# Patient Record
Sex: Female | Born: 1945 | Race: White | Hispanic: No | Marital: Married
Health system: Southern US, Community
[De-identification: ages and names within clinical notes are randomized; demographics above are authoritative.]

---

## 1998-09-10 ENCOUNTER — Other Ambulatory Visit: Admission: RE | Admit: 1998-09-10 | Discharge: 1998-09-10 | Payer: Self-pay | Admitting: *Deleted

## 2000-01-09 ENCOUNTER — Other Ambulatory Visit: Admission: RE | Admit: 2000-01-09 | Discharge: 2000-01-09 | Payer: Self-pay | Admitting: Obstetrics & Gynecology

## 2000-03-05 ENCOUNTER — Encounter: Admission: RE | Admit: 2000-03-05 | Discharge: 2000-03-05 | Payer: Self-pay | Admitting: Obstetrics & Gynecology

## 2000-03-05 ENCOUNTER — Encounter: Payer: Self-pay | Admitting: Obstetrics & Gynecology

## 2000-04-10 ENCOUNTER — Other Ambulatory Visit: Admission: RE | Admit: 2000-04-10 | Discharge: 2000-04-10 | Payer: Self-pay | Admitting: Obstetrics & Gynecology

## 2000-09-23 ENCOUNTER — Other Ambulatory Visit: Admission: RE | Admit: 2000-09-23 | Discharge: 2000-09-23 | Payer: Self-pay | Admitting: Obstetrics & Gynecology

## 2001-02-24 ENCOUNTER — Other Ambulatory Visit: Admission: RE | Admit: 2001-02-24 | Discharge: 2001-02-24 | Payer: Self-pay | Admitting: Obstetrics & Gynecology

## 2001-03-08 ENCOUNTER — Encounter: Admission: RE | Admit: 2001-03-08 | Discharge: 2001-03-08 | Payer: Self-pay | Admitting: Obstetrics & Gynecology

## 2001-03-08 ENCOUNTER — Encounter: Payer: Self-pay | Admitting: Obstetrics & Gynecology

## 2004-02-13 ENCOUNTER — Ambulatory Visit: Payer: Self-pay | Admitting: Unknown Physician Specialty

## 2008-09-14 ENCOUNTER — Ambulatory Visit: Payer: Self-pay

## 2009-02-12 ENCOUNTER — Ambulatory Visit: Payer: Self-pay | Admitting: General Practice

## 2009-02-26 ENCOUNTER — Inpatient Hospital Stay: Payer: Self-pay | Admitting: General Practice

## 2009-06-05 ENCOUNTER — Ambulatory Visit: Payer: Self-pay | Admitting: General Practice

## 2009-06-18 ENCOUNTER — Inpatient Hospital Stay: Payer: Self-pay | Admitting: General Practice

## 2011-08-01 ENCOUNTER — Ambulatory Visit: Payer: Self-pay | Admitting: Family Medicine

## 2012-05-13 ENCOUNTER — Ambulatory Visit: Payer: Self-pay | Admitting: Nephrology

## 2012-05-18 ENCOUNTER — Other Ambulatory Visit: Payer: Self-pay | Admitting: Nephrology

## 2012-05-18 LAB — CBC WITH DIFFERENTIAL/PLATELET
Basophil #: 0 10*3/uL (ref 0.0–0.1)
Basophil %: 0.9 %
Eosinophil #: 0.6 10*3/uL (ref 0.0–0.7)
Eosinophil %: 13.9 %
HCT: 35.5 % (ref 35.0–47.0)
HGB: 11.8 g/dL — ABNORMAL LOW (ref 12.0–16.0)
Lymphocyte #: 0.9 10*3/uL — ABNORMAL LOW (ref 1.0–3.6)
Lymphocyte %: 21.6 %
MCH: 31.9 pg (ref 26.0–34.0)
MCHC: 33.2 g/dL (ref 32.0–36.0)
MCV: 96 fL (ref 80–100)
Monocyte #: 0.3 x10 3/mm (ref 0.2–0.9)
Monocyte %: 6.7 %
Neutrophil #: 2.4 10*3/uL (ref 1.4–6.5)
Neutrophil %: 56.9 %
Platelet: 178 10*3/uL (ref 150–440)
RBC: 3.69 10*6/uL — ABNORMAL LOW (ref 3.80–5.20)
RDW: 13.7 % (ref 11.5–14.5)
WBC: 4.2 10*3/uL (ref 3.6–11.0)

## 2012-05-18 LAB — COMPREHENSIVE METABOLIC PANEL
Albumin: 3.8 g/dL (ref 3.4–5.0)
Alkaline Phosphatase: 51 U/L (ref 50–136)
Anion Gap: 9 (ref 7–16)
BUN: 39 mg/dL — ABNORMAL HIGH (ref 7–18)
Bilirubin,Total: 0.2 mg/dL (ref 0.2–1.0)
Calcium, Total: 9.2 mg/dL (ref 8.5–10.1)
Chloride: 103 mmol/L (ref 98–107)
Co2: 27 mmol/L (ref 21–32)
Creatinine: 1.43 mg/dL — ABNORMAL HIGH (ref 0.60–1.30)
EGFR (African American): 45 — ABNORMAL LOW
EGFR (Non-African Amer.): 39 — ABNORMAL LOW
Glucose: 267 mg/dL — ABNORMAL HIGH (ref 65–99)
Osmolality: 296 (ref 275–301)
Potassium: 4 mmol/L (ref 3.5–5.1)
SGOT(AST): 18 U/L (ref 15–37)
SGPT (ALT): 30 U/L (ref 12–78)
Sodium: 139 mmol/L (ref 136–145)
Total Protein: 7.2 g/dL (ref 6.4–8.2)

## 2012-05-18 LAB — URINALYSIS, COMPLETE
Bilirubin,UR: NEGATIVE
Blood: NEGATIVE
Glucose,UR: >=500 mg/dL (ref 0–75)
Ketone: NEGATIVE
Nitrite: NEGATIVE
Ph: 5 (ref 4.5–8.0)
Protein: NEGATIVE
RBC,UR: 2 /HPF (ref 0–5)
Specific Gravity: 1.012 (ref 1.003–1.030)
Squamous Epithelial: 1
WBC UR: 5 /HPF (ref 0–5)

## 2012-05-18 LAB — PROTEIN / CREATININE RATIO, URINE
Creatinine, Urine: 54.4 mg/dL (ref 30.0–125.0)
Protein, Random Urine: 11 mg/dL (ref 0–12)
Protein/Creat. Ratio: 202 mg/gCREAT — ABNORMAL HIGH (ref 0–200)

## 2012-05-18 LAB — PROTIME-INR
INR: 0.9
Prothrombin Time: 13 secs (ref 11.5–14.7)

## 2012-05-18 LAB — APTT: Activated PTT: 26.9 secs (ref 23.6–35.9)

## 2012-05-20 ENCOUNTER — Observation Stay: Payer: Self-pay | Admitting: Nephrology

## 2012-05-20 LAB — HEMOGLOBIN: HGB: 10.9 g/dL — ABNORMAL LOW (ref 12.0–16.0)

## 2012-05-21 LAB — URINALYSIS, COMPLETE
Bilirubin,UR: NEGATIVE
Glucose,UR: NEGATIVE mg/dL (ref 0–75)
Ketone: NEGATIVE
Nitrite: NEGATIVE
Ph: 5 (ref 4.5–8.0)
Protein: 100
RBC,UR: 584 /HPF (ref 0–5)
Specific Gravity: 1.011 (ref 1.003–1.030)
Squamous Epithelial: <1
WBC UR: 4 /HPF (ref 0–5)

## 2012-05-21 LAB — CBC WITH DIFFERENTIAL/PLATELET
Basophil #: 0 10*3/uL (ref 0.0–0.1)
Basophil %: 0.9 %
Eosinophil #: 0.7 10*3/uL (ref 0.0–0.7)
Eosinophil %: 13.5 %
HCT: 32.7 % — ABNORMAL LOW (ref 35.0–47.0)
HGB: 10.7 g/dL — ABNORMAL LOW (ref 12.0–16.0)
Lymphocyte #: 1.1 10*3/uL (ref 1.0–3.6)
Lymphocyte %: 23 %
MCH: 31.2 pg (ref 26.0–34.0)
MCHC: 32.7 g/dL (ref 32.0–36.0)
MCV: 95 fL (ref 80–100)
Monocyte #: 0.3 x10 3/mm (ref 0.2–0.9)
Monocyte %: 6 %
Neutrophil #: 2.8 10*3/uL (ref 1.4–6.5)
Neutrophil %: 56.6 %
Platelet: 160 10*3/uL (ref 150–440)
RBC: 3.43 10*6/uL — ABNORMAL LOW (ref 3.80–5.20)
RDW: 13.8 % (ref 11.5–14.5)
WBC: 4.9 10*3/uL (ref 3.6–11.0)

## 2012-05-21 LAB — BASIC METABOLIC PANEL
Anion Gap: 7 (ref 7–16)
BUN: 30 mg/dL — ABNORMAL HIGH (ref 7–18)
Calcium, Total: 8.8 mg/dL (ref 8.5–10.1)
Chloride: 110 mmol/L — ABNORMAL HIGH (ref 98–107)
Creatinine: 1.42 mg/dL — ABNORMAL HIGH (ref 0.60–1.30)
Osmolality: 290 (ref 275–301)

## 2012-06-08 ENCOUNTER — Ambulatory Visit: Payer: Self-pay

## 2012-06-12 ENCOUNTER — Ambulatory Visit: Payer: Self-pay

## 2012-06-22 ENCOUNTER — Ambulatory Visit: Payer: Self-pay | Admitting: Ophthalmology

## 2012-07-06 ENCOUNTER — Ambulatory Visit: Payer: Self-pay | Admitting: Ophthalmology

## 2012-07-10 ENCOUNTER — Ambulatory Visit: Payer: Self-pay

## 2012-08-18 ENCOUNTER — Ambulatory Visit: Payer: Self-pay | Admitting: Ophthalmology

## 2013-12-21 ENCOUNTER — Encounter: Payer: Self-pay | Admitting: Obstetrics and Gynecology

## 2014-01-10 ENCOUNTER — Encounter: Payer: Self-pay | Admitting: Obstetrics and Gynecology

## 2014-02-09 ENCOUNTER — Encounter: Payer: Self-pay | Admitting: Obstetrics and Gynecology

## 2014-03-09 IMAGING — CT CT ABD-PELV W/ CM
1 of 3 series · 14 of 32 positions shown, 19 images · IV contrast (isovue)
Comparison: None

REASON FOR EXAM: abd pain gen  LLQ pelvic pain  pt takes Metformin
COMMENTS:

PROCEDURE:     KCT - KCT ABDOMEN/PELVIS W  - August 01, 2011  [DATE]
RESULT:     History: Abdominal pain
TECHNIQUE: Multiple axial images of the abdomen and pelvis were performed
from the lung bases to the pubic symphysis, with p.o. contrast and with 85
mL of Isovue 300 intravenous contrast.

[Series 2: abd with 5.0 i40f 3 · axial · 0.82mm/px · z∈[-262,+128]mm · 14 of 88 slices shown, 19 images]
[im 5/88  soft-tissue]
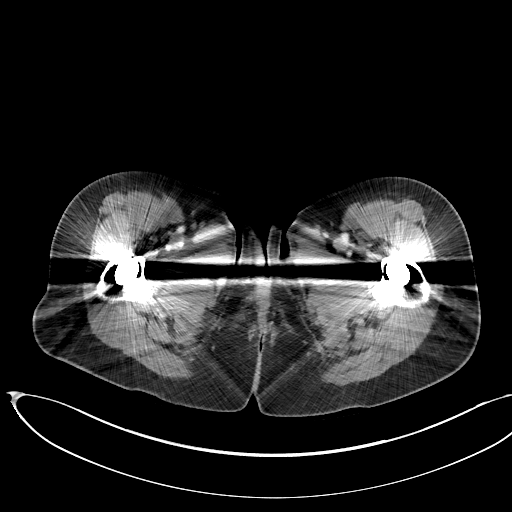
[im 5/88  bone]
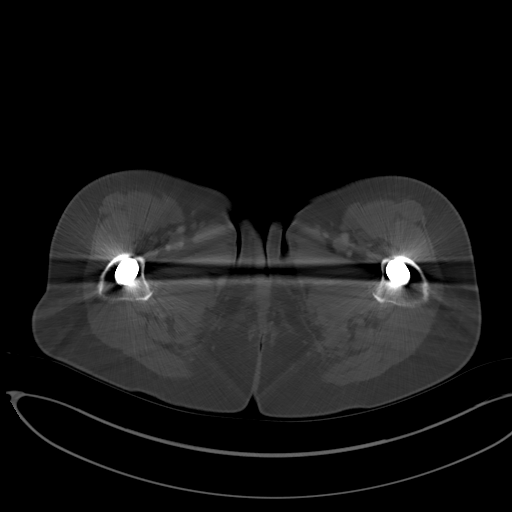
[im 10/88  soft-tissue]
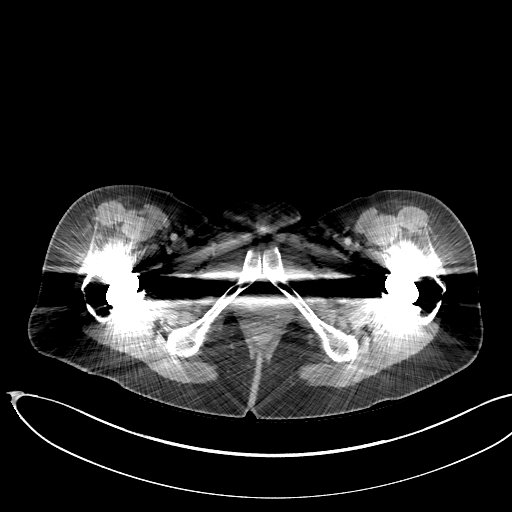
[im 20/88  soft-tissue]
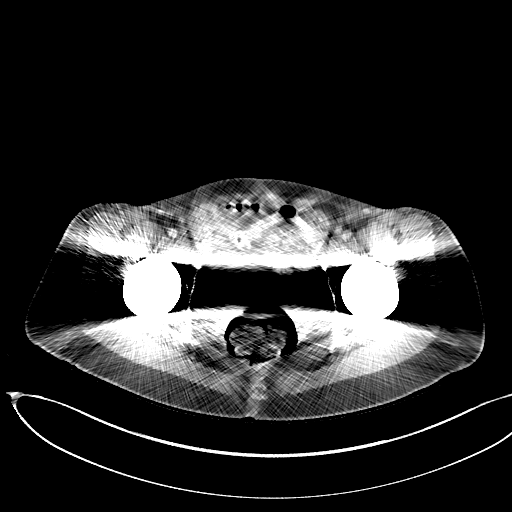
[im 25/88  soft-tissue]
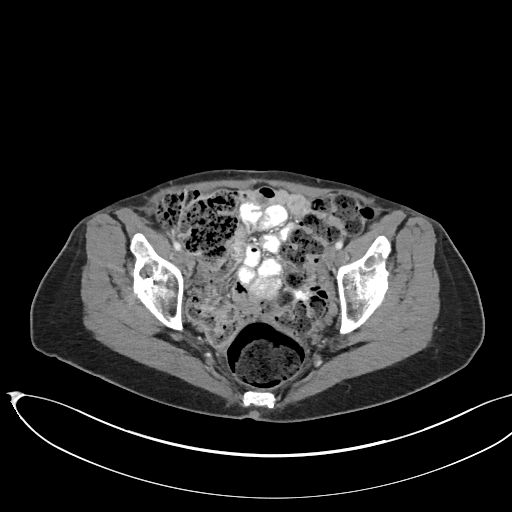
[im 30/88  soft-tissue]
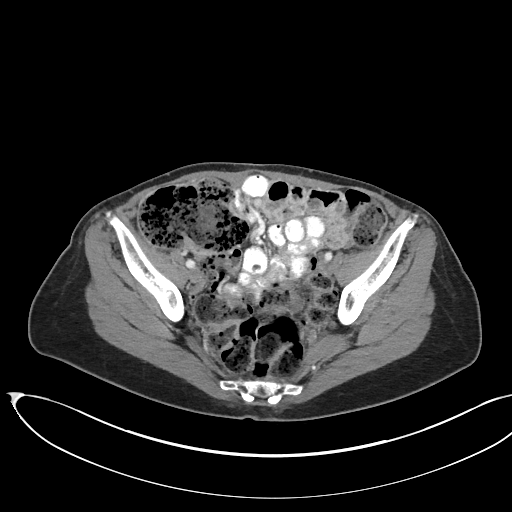
[im 39/88  soft-tissue]
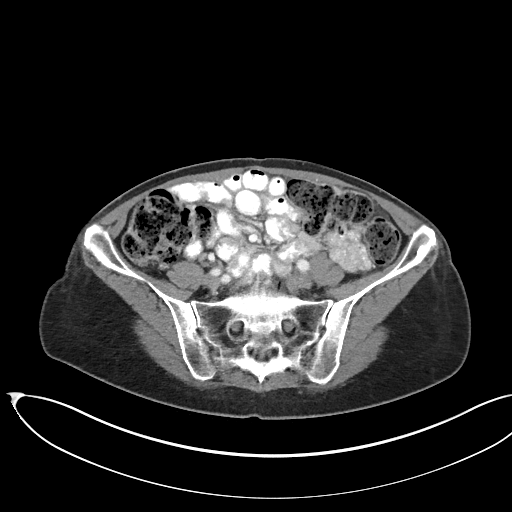
[im 44/88  soft-tissue]
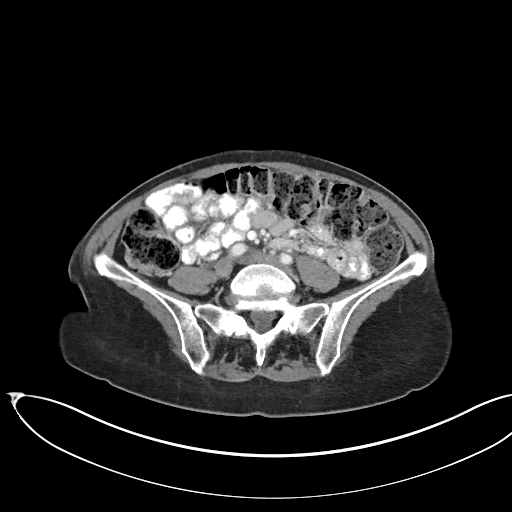
[im 49/88  soft-tissue]
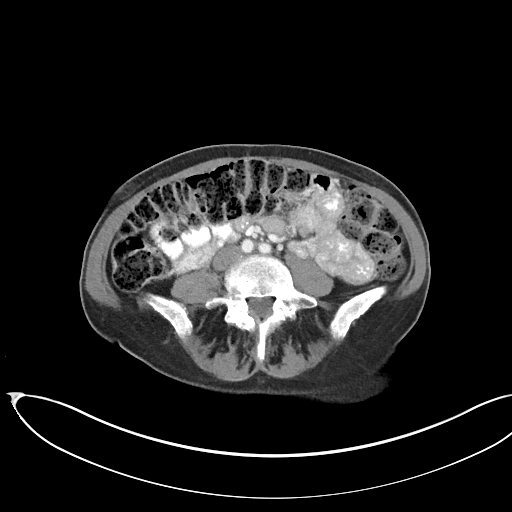
[im 59/88  soft-tissue]
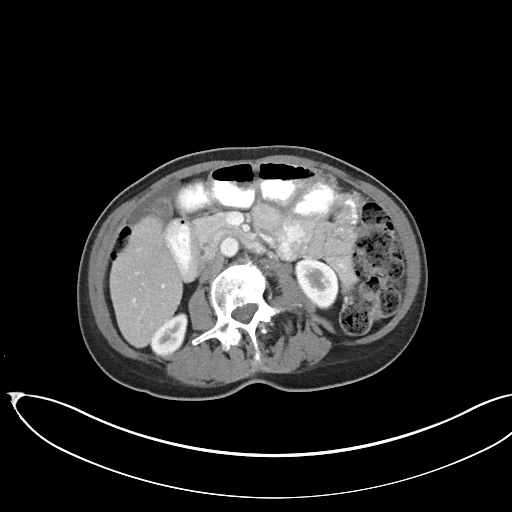
[im 59/88  bone]
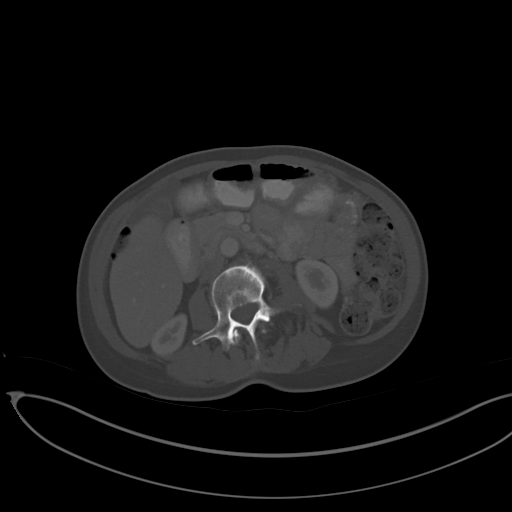
[im 63/88  soft-tissue]
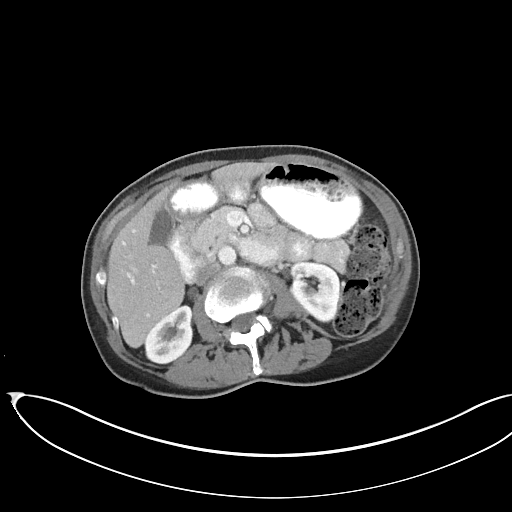
[im 68/88  soft-tissue]
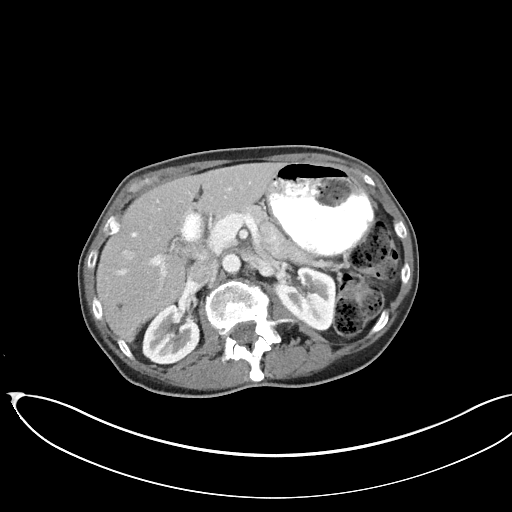
[im 68/88  lung]
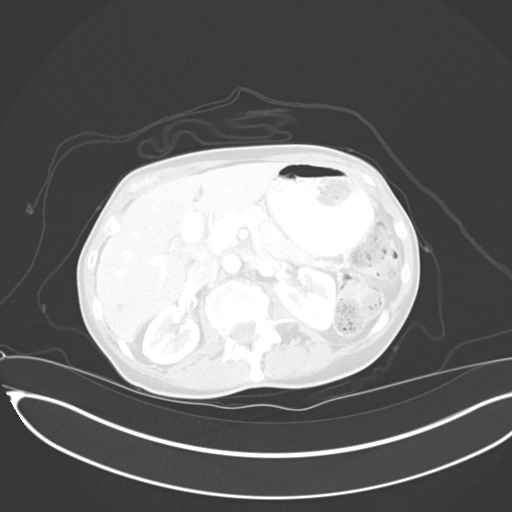
[im 73/88  lung]
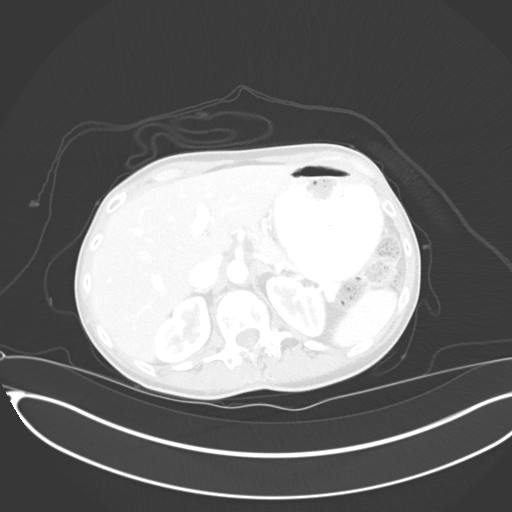
[im 78/88  soft-tissue]
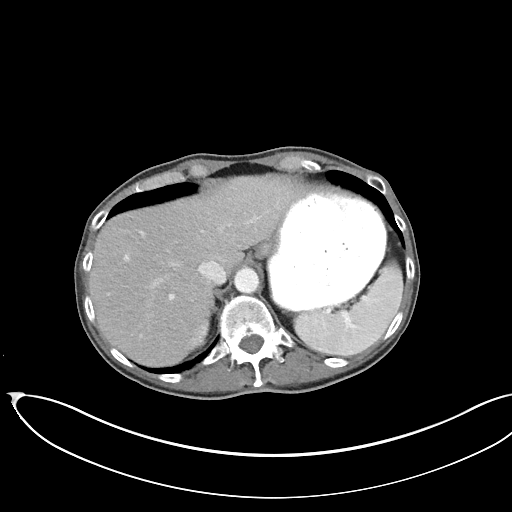
[im 78/88  lung]
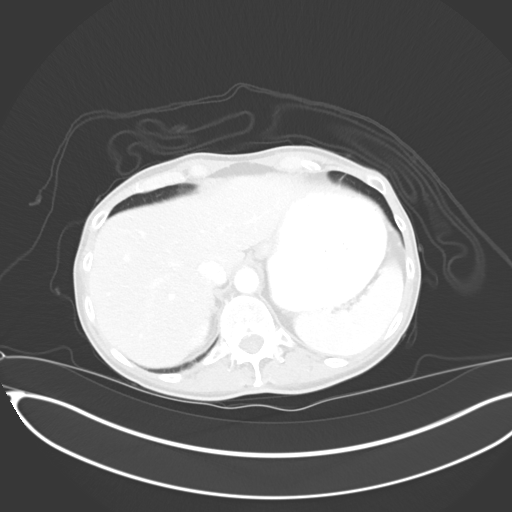
[im 83/88  soft-tissue]
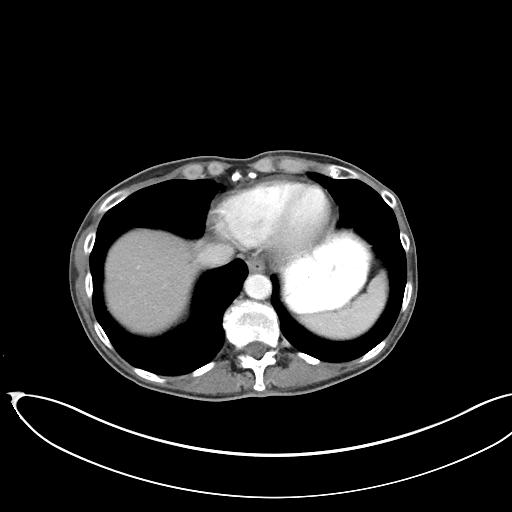
[im 83/88  lung]
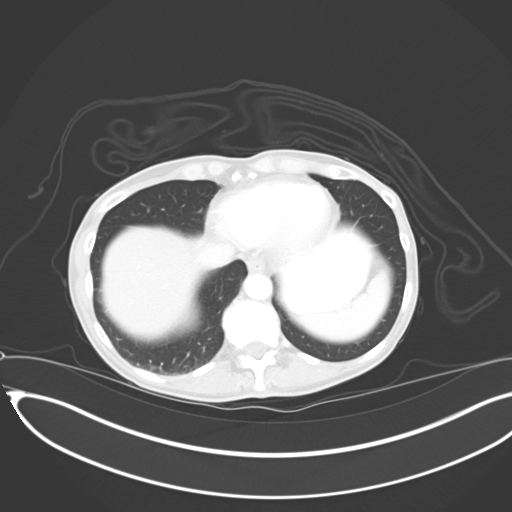

[14 of 32 positions shown; findings below may reference images not displayed]

FINDINGS: The lung bases are clear. There is no pneumothorax. The heart size is
normal.

The liver demonstrates no focal abnormality. There is no intrahepatic or
extrahepatic biliary ductal dilatation. The gallbladder is unremarkable. The
spleen demonstrates no focal abnormality. The kidneys, adrenal glands, and
pancreas are normal. The bladder is unremarkable.

The stomach, duodenum, small intestine, and large intestine demonstrate no
contrast extravasation or dilatation. There is a large amount stool
throughout the colon. There is no pneumoperitoneum, pneumatosis, or portal
venous gas. There is no abdominal or pelvic free fluid. There is no
lymphadenopathy.

The abdominal aorta is normal in caliber .

The osseous structures are unremarkable.
IMPRESSION: 1. No acute abdominal or pelvic pathology.
2. Large amount stool throughout the colon. Correlate with signs and
symptoms of constipation.

## 2014-04-03 ENCOUNTER — Ambulatory Visit: Payer: Self-pay | Admitting: Unknown Physician Specialty

## 2014-09-01 NOTE — Op Note (Signed)
PATIENT NAME:  Angel [name], Angel [name] MR#:  [id number] DATE OF BIRTH:  08-Dec-1945  DATE OF PROCEDURE:  05/20/2012  SURGEON:  Mady HaagensenMunsoor Jebadiah Imperato, M.D.  REASON FOR PROCEDURE AND PREOPERATIVE DIAGNOSES:  1.  Chronic kidney disease, stage III.  2.  Diabetes mellitus, type 2.   POSTOPERATIVE DIAGNOSES: 1.  Chronic kidney disease, stage III.  2.  Diabetes mellitus, type 2.    PROCEDURE: Percutaneous ultrasound-guided left renal biopsy.    DESCRIPTION OF PROCEDURE: After obtaining informed consent, the patient was brought down to the ultrasound suite. Subsequently, the left kidney was identified under ultrasound. The left flank was prepped and draped in standard sterile fashion. Local anesthesia was achieved using 1% lidocaine. Subsequently, using an 18-gauge biopsy device and ultrasound guidance, [name] total of 3 passes were made into the left kidney. The specimens were submitted to pathology, and the pathologist deemed the specimens to be adequate. No immediate complications were noted. The patient tolerated the procedure very well. She will return to her room for continued observation.   ESTIMATED BLOOD LOSS: None.   COMPLICATIONS: No immediate complications.    ____________________________ Lennox PippinsMunsoor N. Daymon Hora, MD mnl:dm D: 05/20/2012 11:49:43 ET T: 05/20/2012 12:00:53 ET JOB#: [id number]  cc: Lennox PippinsMunsoor N. Jandel Patriarca, MD, <Dictator> Rhona LeavensJames F. Burnett ShengHedrick, MD Lennox PippinsMUNSOOR N Enez Monahan MD ELECTRONICALLY SIGNED 06/18/2012 22:47

## 2014-09-01 NOTE — Op Note (Signed)
PATIENT NAME:  Angel [name], Angel [name] MR#:  [id number] DATE OF BIRTH:  October 28, 1945  DATE OF PROCEDURE:  07/06/2012  PREOPERATIVE DIAGNOSIS: Visually significant cataract of the left eye.   POSTOPERATIVE DIAGNOSIS: Visually significant cataract of the left eye.   OPERATIVE PROCEDURE: Cataract extraction by phacoemulsification with implant of torque intraocular lens to the left eye.  Please note the lens was rotated to [name] final resting position of 54 degrees.  SURGEON: Galen ManilaWilliam Taelor Moncada, MD.   ANESTHESIA:  1. Managed anesthesia care.  2. Topical tetracaine drops followed by 2% Xylocaine jelly applied in the preoperative holding area.   COMPLICATIONS: None.   TECHNIQUE:  Stop and chop.  DESCRIPTION OF PROCEDURE: The patient was examined and consented in the preoperative holding area where the aforementioned topical anesthesia was applied to the left eye and then brought back to the Operating Room where the left eye was prepped and draped in the usual sterile ophthalmic fashion and [name] lid speculum was placed. [name] paracentesis was created with the side port blade and the anterior chamber was filled with viscoelastic. [name] near clear corneal incision was performed with the steel keratome. [name] continuous curvilinear capsulorrhexis was performed with [name] cystotome followed by the capsulorrhexis forceps. Hydrodissection and hydrodelineation were carried out with BSS on [name] blunt cannula. The lens was removed in [name] stop and chop technique and the remaining cortical material was removed with the irrigation-aspiration handpiece. The capsular bag was inflated with viscoelastic and the Torque ZCT300, 21.0-diopter lens, serial number [id number] was placed in the capsular bag without complication. The remaining viscoelastic was removed from the eye with the irrigation-aspiration handpiece. The wounds were hydrated. The anterior chamber was flushed with Miostat and the eye was inflated to physiologic pressure. 0.1 mL of cefuroxime  concentration 10 mg/mL was placed in the anterior chamber. The wounds were found to be water tight. The eye was dressed with Vigamox. The patient was given protective glasses to wear throughout the day and [name] shield with which to sleep tonight. The patient was also given drops with which to begin [name] drop regimen today and will follow-up with me in one day.     ____________________________ Jerilee FieldWilliam L. Marquinn Meschke, MD wlp:ct D: 07/06/2012 14:06:36 ET T: 07/06/2012 14:46:08 ET JOB#: [id number]  cc: Latrica Clowers L. Meril Dray, MD, <Dictator> Jerilee FieldWILLIAM L Dorlisa Savino MD ELECTRONICALLY SIGNED 07/23/2012 17:10

## 2014-09-01 NOTE — Op Note (Signed)
PATIENT NAME:  Angel [name], Angel [name] MR#:  [id number] DATE OF BIRTH:  1945/10/10  DATE OF PROCEDURE:  08/18/2012  PREOPERATIVE DIAGNOSIS: Malrotated Toric intraocular lens.  POSTOPERATIVE DIAGNOSIS: Malrotated Toric intraocular lens.   OPERATIVE PROCEDURE: Repositioning of intraocular lens in the right eye.   SURGEON: Jerilee FieldWilliam L. Shaniece Bussa, MD.  ANESTHESIA: Topical anesthesia.   COMPLICATIONS: None.   PROCEDURE IN DETAIL: The patient was examined and consented for this procedure in the preoperative holding area where she was then brought back to the operating room where the anesthesia team employed managed anesthesia care. The right eye was prepped and draped in the usual sterile ophthalmic fashion. [name] paracentesis was created and the anterior chamber was filled with DisCoVisc. The DisCoVisc cannula was used to gently Visco-dissect the anterior capsule from the anterior face of the intraocular lens. Viscoelastic was then worked around the entire lens, partially inflating the posterior aspect of the capsule. The Sinskey hook was used to gently release the haptics from the capsular equator. Once the lens was freely mobile within the capsule,  it was rotated to the calculated position of 42 degrees. The viscoelastic was removed from the eye with the irrigation-aspiration handpiece. The wounds were hydrated. The eye was found to be watertight. The anterior chamber was flushed with cefuroxime, 0.1 mL containing 1 mg of cefuroxime.    ____________________________ Jerilee FieldWilliam L. Amoura Ransier, MD wlp:aw D: 08/18/2012 11:15:09 ET T: 08/18/2012 11:43:57 ET JOB#: [id number]  cc: Kishon Garriga L. Amirah Goerke, MD, <Dictator> Jerilee FieldWILLIAM L Zoha Spranger MD ELECTRONICALLY SIGNED 08/24/2012 12:52

## 2014-12-20 IMAGING — CT CT ABD-PELV W/O CM
1 of 2 series · 15 of 32 positions shown, 19 images · non-contrast
Comparison: none

REASON FOR EXAM: CALL REPORT [DATE] CKD III Difficult to visualize left
kidney on office US
COMMENTS:

PROCEDURE:     KCT - KCT ABDOMEN/PELVIS WO  - May 13, 2012  [DATE]
RESULT:     Comparison: 08/01/2011
TECHNIQUE: Multiple axial images from the lung bases to the symphysis pubis
were obtained without oral and without intravenous contrast.

[Series 2: abd 3mm wo 3.0 i40f 3 · axial · 0.69mm/px · z∈[-958,-586]mm · 15 of 134 slices shown, 19 images]
[im 5/134  soft-tissue]
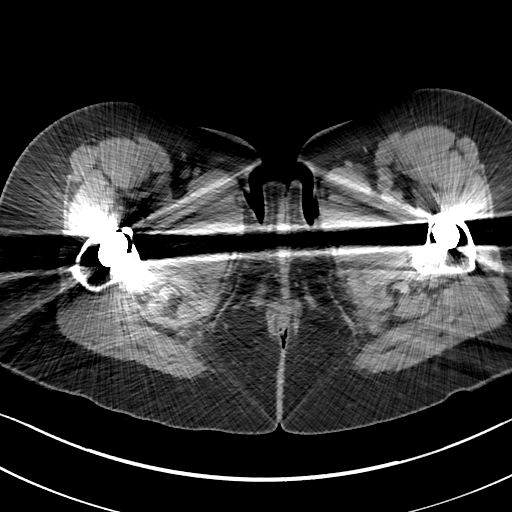
[im 5/134  bone]
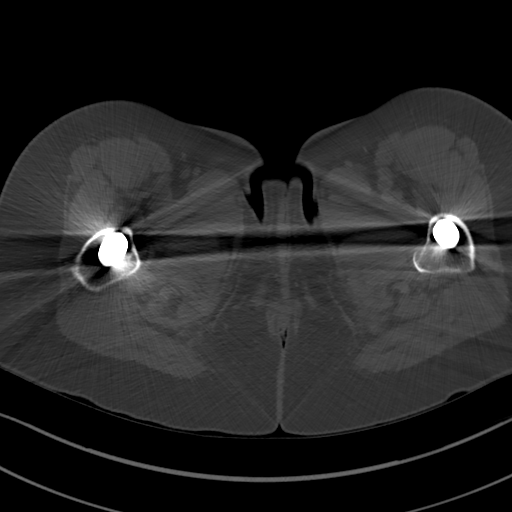
[im 20/134  soft-tissue]
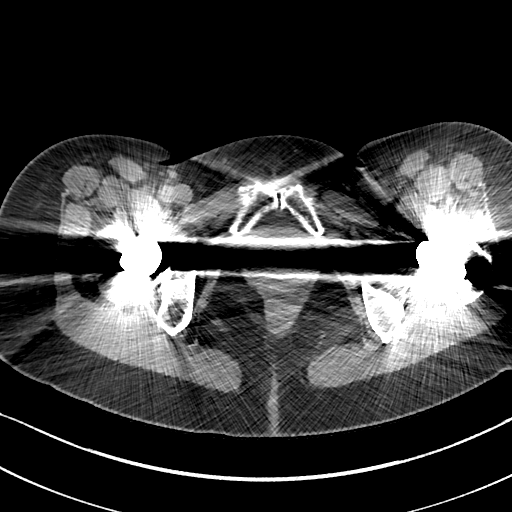
[im 40/134  soft-tissue]
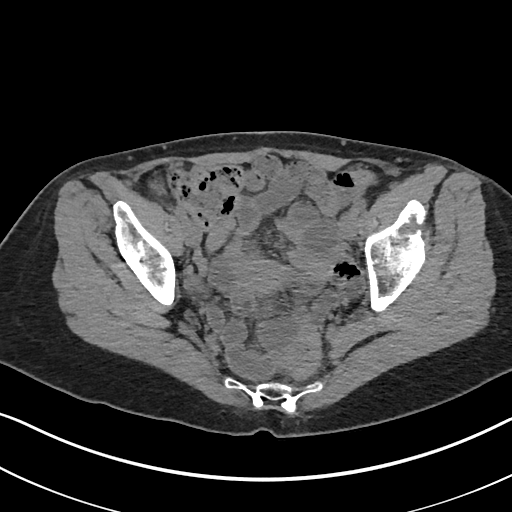
[im 50/134  soft-tissue]
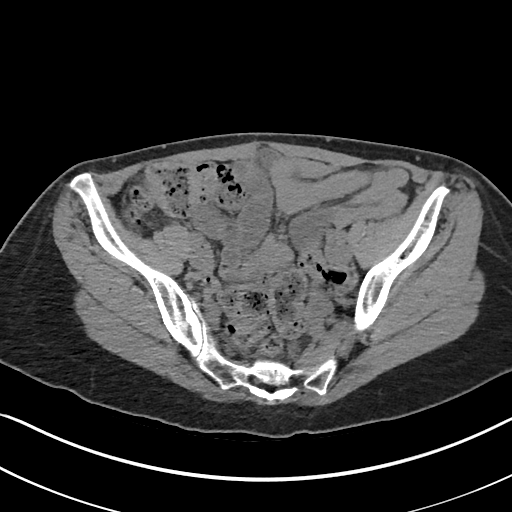
[im 55/134  soft-tissue]
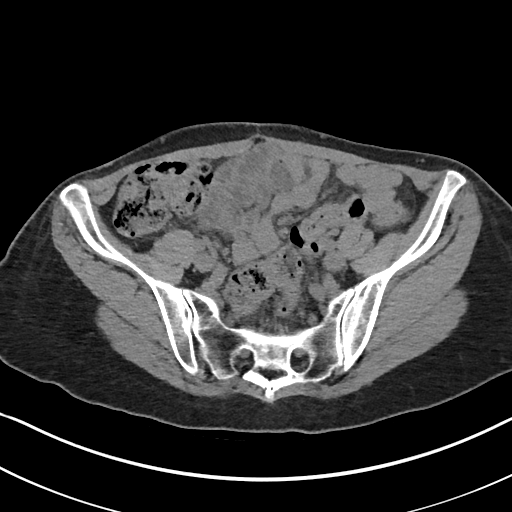
[im 65/134  soft-tissue]
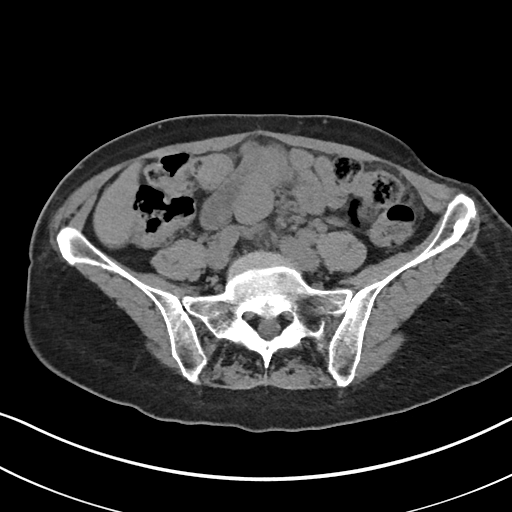
[im 74/134  soft-tissue]
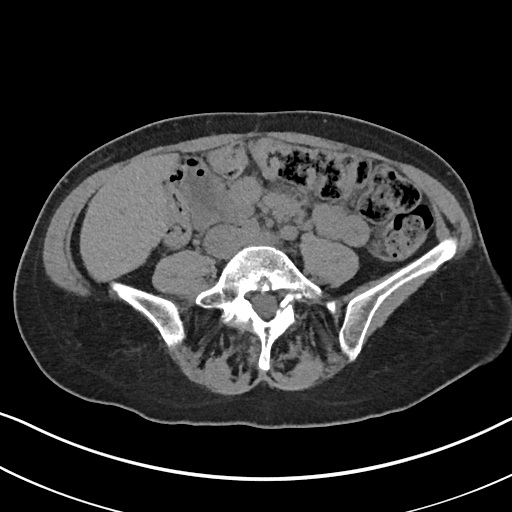
[im 84/134  soft-tissue]
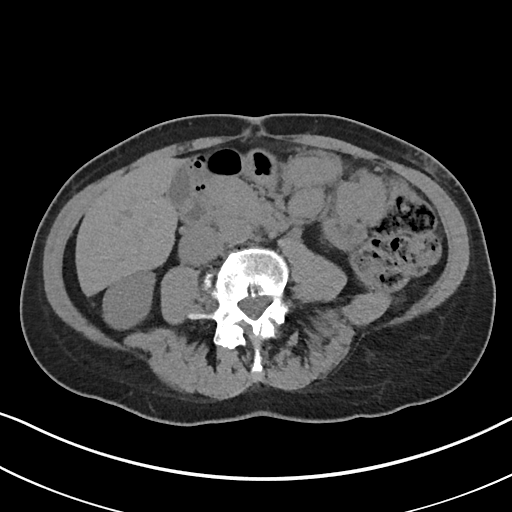
[im 94/134  soft-tissue]
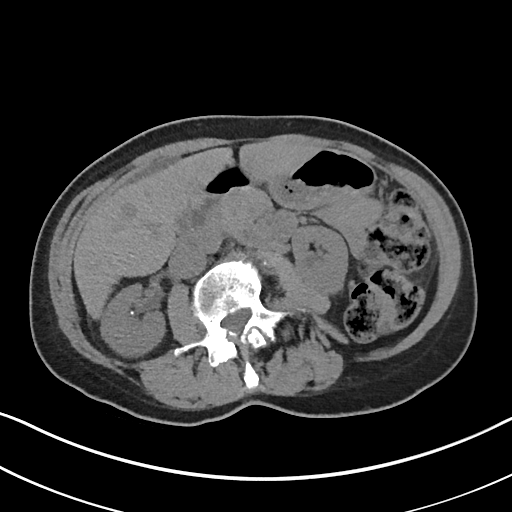
[im 94/134  bone]
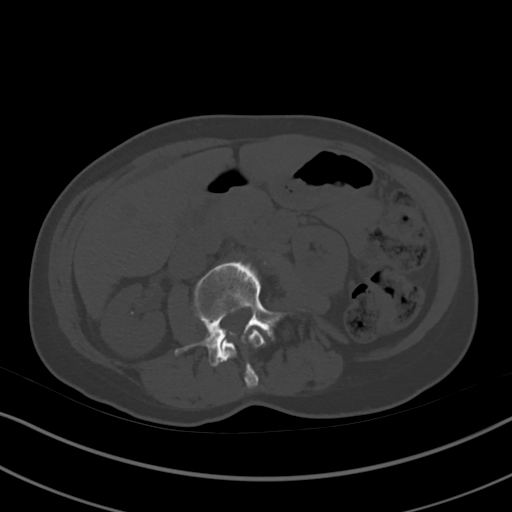
[im 99/134  soft-tissue]
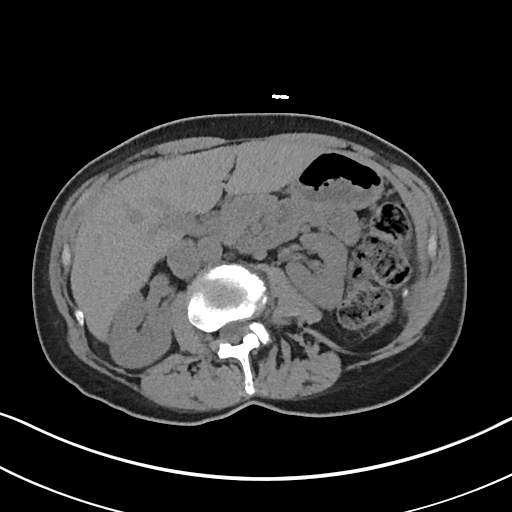
[im 109/134  soft-tissue]
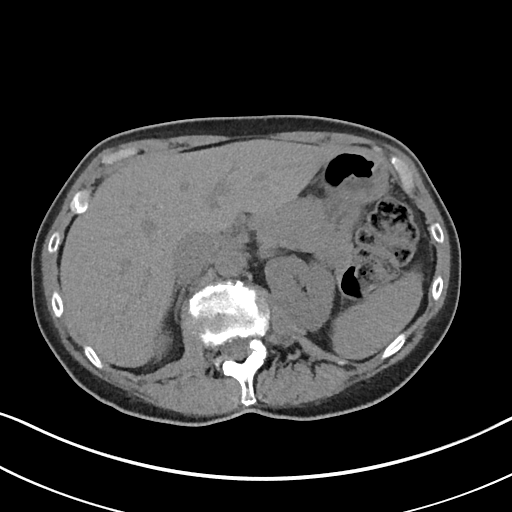
[im 114/134  lung]
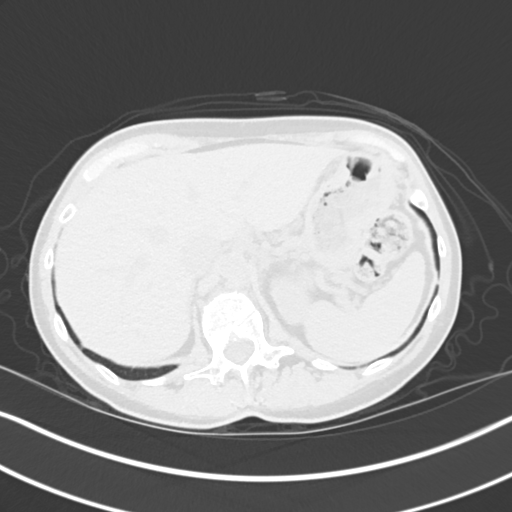
[im 119/134  soft-tissue]
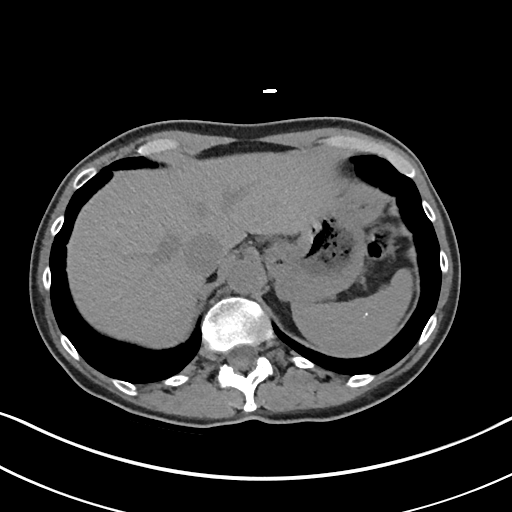
[im 119/134  lung]
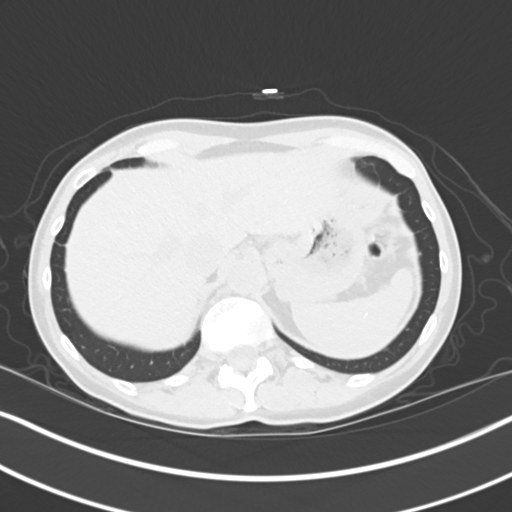
[im 124/134  lung]
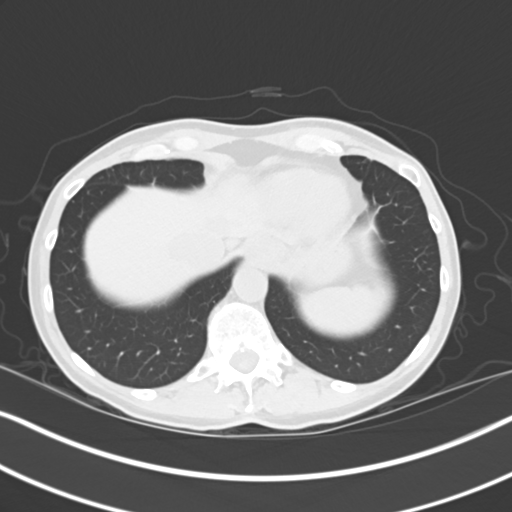
[im 129/134  soft-tissue]
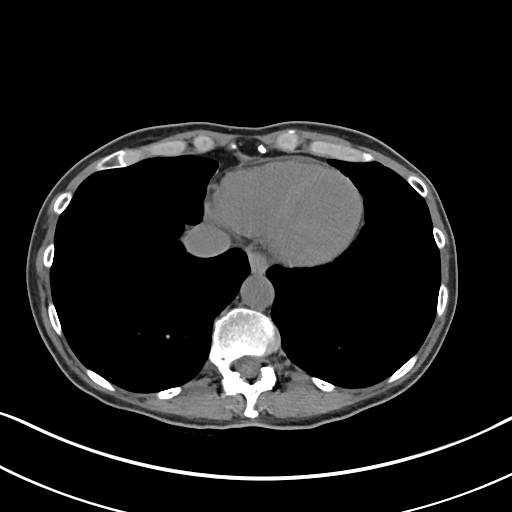
[im 129/134  lung]
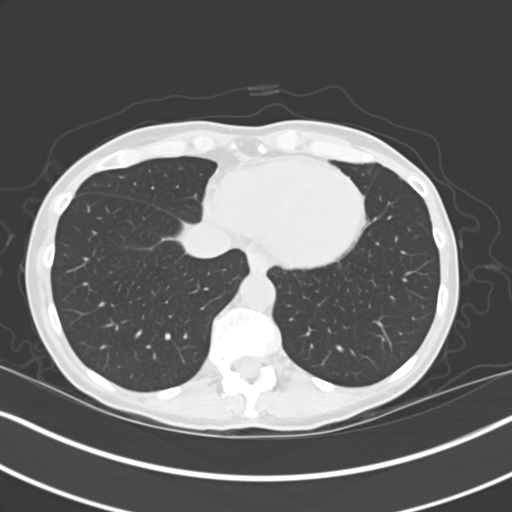

[15 of 32 positions shown; findings below may reference images not displayed]

FINDINGS: There is a subcentimeter calcified nodule in the right lower lobe.

Lack of intravenous contrast limits evaluation of the solid abdominal
organs.  There is a trace amount of fluid anterior to the liver, which is
nonspecific. Small low-attenuation focus in the right hepatic lobe is too
small to characterize, but similar to prior. The gallbladder, adrenals, and
pancreas are unremarkable. Punctate calcifications in the spleen are likely
sequela of old prior infection. There is a 4 mm calculus in the right
kidney. No hydronephrosis. Small low-attenuation lesion in the left kidney
likely represents a cyst. Evaluation of the ureters is difficult secondary
to lack of intra-abdominal fat and the adjacent decompressed bowel. No
definite ureterectasis. The ureters cannot be evaluated in the pelvis
secondary to streak artifact from the bilateral hip arthroplasties.

The small and large bowel are normal in caliber. There is a small tubular
structure in the right lower quadrant which is felt to represent a normal
appendix.

No aggressive lytic or sclerotic osseous lesions are identified.
IMPRESSION: Right-sided nephrolithiasis, without hydronephrosis.
